# Patient Record
Sex: Male | Born: 1990 | Race: Black or African American | Hispanic: No | Marital: Single | Smoking: Current some day smoker
Health system: Southern US, Community
[De-identification: ages and names within clinical notes are randomized; demographics above are authoritative.]

---
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
## 2011-04-19 ENCOUNTER — Encounter: Payer: Self-pay | Admitting: Adult Health

## 2011-04-19 ENCOUNTER — Emergency Department (HOSPITAL_COMMUNITY)
Admission: EM | Admit: 2011-04-19 | Discharge: 2011-04-19 | Disposition: A | Discharge status: Home / Self Care | Payer: Self-pay | Attending: Emergency Medicine | Admitting: Emergency Medicine

## 2011-04-19 DIAGNOSIS — Z202 Contact with and (suspected) exposure to infections with a predominantly sexual mode of transmission: Secondary | ICD-10-CM

## 2011-04-19 MED ORDER — LIDOCAINE HCL 1 % IJ SOLN
INTRAMUSCULAR | Status: AC
  Administered 2011-04-19: 20 mL via INTRAMUSCULAR
  Filled 2011-04-19: qty 20

## 2011-04-19 MED ORDER — CEFTRIAXONE SODIUM 250 MG IJ SOLR
250.0000 mg | Freq: Once | INTRAMUSCULAR | Status: AC
  Administered 2011-04-19: 250 mg via INTRAMUSCULAR
  Filled 2011-04-19: qty 250

## 2011-04-19 MED ORDER — AZITHROMYCIN 250 MG PO TABS
1000.0000 mg | ORAL_TABLET | Freq: Once | ORAL | Status: AC
  Administered 2011-04-19: 1000 mg via ORAL
  Filled 2011-04-19: qty 4

## 2011-04-19 NOTE — ED Notes (Signed)
Denies sx. Partner has positive sx

## 2011-04-19 NOTE — ED Notes (Signed)
Partner is experiencing STD symptoms. Pt denies symptoms but is wanting to be tested.

## 2011-04-19 NOTE — ED Provider Notes (Signed)
Medical screening examination/treatment/procedure(s) were performed by non-physician practitioner and as supervising physician I was immediately available for consultation/collaboration.  Flint Melter, MD 04/19/11 [phone]

## 2011-04-19 NOTE — ED Provider Notes (Signed)
History     CSN: [id number] Arrival date & time: 04/19/2011 12:43 PM   First MD Initiated Contact with Patient 04/19/11 1518      Chief Complaint  Patient presents with  . Exposure to STD    (Consider location/radiation/quality/duration/timing/severity/associated sxs/prior treatment) Patient is a 20 y.o. male presenting with STD exposure. The history is provided by the patient.  Exposure to STD This is a new problem. Pertinent negatives include no abdominal pain, chills, fever, nausea, rash, sore throat or vomiting.  Pt states his boyfriend has had some penile discharge. Was seen and treated for possible STI. States that he has no symptoms but has had intercourse with his boyfriend while he had the discharge. Pt denies fever, chills, penile discharge, pain with urination, abdominal pain.   History reviewed. No pertinent past medical history.  History reviewed. No pertinent past surgical history.  History reviewed. No pertinent family history.  History  Substance Use Topics  . Smoking status: Never Smoker   . Smokeless tobacco: Not on file  . Alcohol Use: Yes      Review of Systems  Constitutional: Negative for fever and chills.  HENT: Negative for sore throat.   Gastrointestinal: Negative for nausea, vomiting and abdominal pain.  Skin: Negative for rash.  All other systems reviewed and are negative.    Allergies  Review of patient's allergies indicates no known allergies.  Home Medications  No current outpatient prescriptions on file.  BP 117/95  Pulse 76  Temp 98.9 F (37.2 C)  Resp 20  SpO2 100%  Physical Exam  Nursing note and vitals reviewed. Constitutional: He is oriented to person, place, and time. He appears well-developed and well-nourished.  Eyes: Conjunctivae are normal.  Neck: Neck supple.  Cardiovascular: Normal rate, regular rhythm and normal heart sounds.   Pulmonary/Chest: Effort normal and breath sounds normal. No respiratory distress.    Genitourinary: Penis normal. No penile tenderness.       No penile lesions or discharge  Neurological: He is alert and oriented to person, place, and time.  Skin: Skin is warm and dry. No rash noted.  Psychiatric: He has a normal mood and affect. His behavior is normal.    ED Course  Procedures (including critical care time)  Genital GC/Chlamydia cultures sent. Pt has not symptoms but has had direct exposure. Will treat with Rocephin 250mg  IM and Zithromax 1g PO.    MDM          Lottie Mussel, PA 04/19/11 1554

## 2011-04-20 LAB — GC/CHLAMYDIA PROBE AMP, GENITAL
Chlamydia, DNA Probe: NEGATIVE
GC Probe Amp, Genital: NEGATIVE

## 2011-06-18 ENCOUNTER — Emergency Department (HOSPITAL_COMMUNITY)
Admission: EM | Admit: 2011-06-18 | Discharge: 2011-06-18 | Disposition: A | Discharge status: Home / Self Care | Payer: Self-pay | Attending: Emergency Medicine | Admitting: Emergency Medicine

## 2011-06-18 ENCOUNTER — Encounter (HOSPITAL_COMMUNITY): Payer: Self-pay | Admitting: Emergency Medicine

## 2011-06-18 DIAGNOSIS — R112 Nausea with vomiting, unspecified: Secondary | ICD-10-CM | POA: Insufficient documentation

## 2011-06-18 DIAGNOSIS — R509 Fever, unspecified: Secondary | ICD-10-CM | POA: Insufficient documentation

## 2011-06-18 DIAGNOSIS — F17200 Nicotine dependence, unspecified, uncomplicated: Secondary | ICD-10-CM | POA: Insufficient documentation

## 2011-06-18 DIAGNOSIS — R109 Unspecified abdominal pain: Secondary | ICD-10-CM | POA: Insufficient documentation

## 2011-06-18 DIAGNOSIS — R197 Diarrhea, unspecified: Secondary | ICD-10-CM | POA: Insufficient documentation

## 2011-06-18 MED ORDER — ONDANSETRON 8 MG PO TBDP
8.0000 mg | ORAL_TABLET | Freq: Once | ORAL | Status: AC
  Administered 2011-06-18: 8 mg via ORAL
  Filled 2011-06-18: qty 1

## 2011-06-18 NOTE — Discharge Instructions (Signed)
Try to drink plenty of fluids over the next few days.  You may return to the ER at any time for worsening condition or any new symptoms that concern you.   B.R.A.T. Diet Your doctor has recommended the B.R.A.T. diet for you or your child until the condition improves. This is often used to help control diarrhea and vomiting symptoms. If you or your child can tolerate clear liquids, you may have:  Bananas.   Rice.   Applesauce.   Toast (and other simple starches such as crackers, potatoes, noodles).  Be sure to avoid dairy products, meats, and fatty foods until symptoms are better. Fruit juices such as apple, grape, and prune juice can make diarrhea worse. Avoid these. Continue this diet for 2 days or as instructed by your caregiver. Document Released: 04/24/2005 Document Revised: 01/04/2011 Document Reviewed: 10/11/2006 [facility] Patient Information 2012 [place], [place].

## 2011-06-18 NOTE — ED Provider Notes (Signed)
History     CSN: [id number]  Arrival date & time 06/18/11  1138   First MD Initiated Contact with Patient 06/18/11 1207      Chief Complaint  Patient presents with  . Nausea    x 24 hrs  . Fever    "approx 100"  . Emesis    x 8 in 24 hrs  . Diarrhea    x 2    (Consider location/radiation/quality/duration/timing/severity/associated sxs/prior treatment) HPI Comments: Patient reports nausea and vomiting and diarrhea that began last night. Reports that his boyfriend has had the same symptoms and the patient has been taking care of his boyfriend while he has been sick.  Patient states that he had about 8 episodes of diarrhea and a few episodes of vomiting. States he had one or 2 episodes this morning but is largely feeling much better. States that his abdomen is sore but does not "really hurt."  Denies any medical problems. Denies any blood in his emesis or diarrhea. Denies any urinary symptoms  Patient is a 21 y.o. male presenting with fever, vomiting, and diarrhea. The history is provided by the patient.  Fever Primary symptoms of the febrile illness include fever, vomiting and diarrhea. Primary symptoms do not include abdominal pain or dysuria.  Emesis  Associated symptoms include diarrhea and a fever. Pertinent negatives include no abdominal pain.  Diarrhea The primary symptoms include fever, vomiting and diarrhea. Primary symptoms do not include abdominal pain or dysuria.    History reviewed. No pertinent past medical history.  History reviewed. No pertinent past surgical history.  Family History  Problem Relation Age of Onset  . Hypertension Mother     History  Substance Use Topics  . Smoking status: Current Some Day Smoker  . Smokeless tobacco: Not on file  . Alcohol Use: Yes      Review of Systems  Constitutional: Positive for fever.  Gastrointestinal: Positive for vomiting and diarrhea. Negative for abdominal pain, blood in stool, abdominal distention and anal  bleeding.  Genitourinary: Negative for dysuria, urgency and frequency.  Neurological: Negative for weakness.    Allergies  Review of patient's allergies indicates no known allergies.  Home Medications   Current Outpatient Rx  Name Route Sig Dispense Refill  . NAPROXEN SODIUM 220 MG PO TABS Oral Take 220 mg by mouth 2 (two) times daily with a meal.    . SODIUM & POTASSIUM BICARBONATE PO TBEF Oral Take 1 tablet by mouth daily as needed. For cold symptom relief      BP 133/75  Pulse 85  Temp 100 F (37.8 C)  Resp 18  Wt 125 lb (56.7 kg)  SpO2 100%  Physical Exam  Nursing note and vitals reviewed. Constitutional: He is oriented to person, place, and time. He appears well-developed and well-nourished. No distress.  HENT:  Head: Normocephalic and atraumatic.  Neck: Neck supple.  Cardiovascular: Normal rate, regular rhythm and normal heart sounds.   Pulmonary/Chest: Breath sounds normal. No respiratory distress. He has no wheezes. He has no rales. He exhibits no tenderness.  Abdominal: Soft. Bowel sounds are normal. He exhibits no distension and no mass. There is no tenderness. There is no rebound and no guarding.  Neurological: He is alert and oriented to person, place, and time. He exhibits normal muscle tone.  Skin: No pallor.  Psychiatric: He has a normal mood and affect. His behavior is normal. Judgment and thought content normal.    ED Course  Procedures (including critical care time)  Labs Reviewed - No data to display No results found.   1. Nausea vomiting and diarrhea       MDM  Nontoxic, well-hydrated patient with history of nausea vomiting diarrhea x1 day. Patient history is positive for close sick contacts.  Patient states that he is already feeling much better but was required to come to the doctor to get a work note so he can return to work.  Patient discharged home.  Pt declines prescription for zofran.  Patient verbalizes understanding and agrees with plan.  Rise Patience, [place] 06/18/11 [phone]

## 2011-06-18 NOTE — ED Provider Notes (Signed)
Medical screening examination/treatment/procedure(s) were performed by non-physician practitioner and as supervising physician I was immediately available for consultation/collaboration.  Jayd Forrey, MD 06/18/11 1641 

## 2011-06-18 NOTE — ED Notes (Signed)
N/V/D/ x 24 hrs. Pts friend has similar sx. Tol sips of gatorade at present

## 2013-03-15 ENCOUNTER — Emergency Department (HOSPITAL_COMMUNITY)
Admission: EM | Admit: 2013-03-15 | Discharge: 2013-03-15 | Disposition: A | Discharge status: Home / Self Care | Payer: No Typology Code available for payment source | Attending: Emergency Medicine | Admitting: Emergency Medicine

## 2013-03-15 ENCOUNTER — Encounter (HOSPITAL_COMMUNITY): Payer: Self-pay | Admitting: Emergency Medicine

## 2013-03-15 DIAGNOSIS — S0990XA Unspecified injury of head, initial encounter: Secondary | ICD-10-CM | POA: Insufficient documentation

## 2013-03-15 DIAGNOSIS — F17200 Nicotine dependence, unspecified, uncomplicated: Secondary | ICD-10-CM | POA: Insufficient documentation

## 2013-03-15 DIAGNOSIS — Z791 Long term (current) use of non-steroidal anti-inflammatories (NSAID): Secondary | ICD-10-CM | POA: Insufficient documentation

## 2013-03-15 DIAGNOSIS — Y9389 Activity, other specified: Secondary | ICD-10-CM | POA: Insufficient documentation

## 2013-03-15 DIAGNOSIS — S0993XA Unspecified injury of face, initial encounter: Secondary | ICD-10-CM | POA: Insufficient documentation

## 2013-03-15 DIAGNOSIS — Y92410 Unspecified street and highway as the place of occurrence of the external cause: Secondary | ICD-10-CM | POA: Insufficient documentation

## 2013-03-15 DIAGNOSIS — IMO0002 Reserved for concepts with insufficient information to code with codable children: Secondary | ICD-10-CM | POA: Insufficient documentation

## 2013-03-15 MED ORDER — DIAZEPAM 5 MG PO TABS: 5.0000 mg | ORAL_TABLET | Freq: Two times a day (BID) | ORAL | Status: DC

## 2013-03-15 MED ORDER — OXYCODONE-ACETAMINOPHEN 5-325 MG PO TABS: 2.0000 | ORAL_TABLET | ORAL | Status: DC | PRN

## 2013-03-15 MED ORDER — KETOROLAC TROMETHAMINE 60 MG/2ML IM SOLN
60.0000 mg | Freq: Once | INTRAMUSCULAR | Status: AC
  Administered 2013-03-15: 60 mg via INTRAMUSCULAR
  Filled 2013-03-15: qty 2
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
## 2013-03-15 NOTE — ED Provider Notes (Signed)
Medical screening examination/treatment/procedure(s) were performed by non-physician practitioner and as supervising physician I was immediately available for consultation/collaboration.  Flint Melter, MD 03/15/13 [phone]

## 2013-08-06 ENCOUNTER — Encounter (HOSPITAL_COMMUNITY): Payer: Self-pay | Admitting: Emergency Medicine

## 2013-08-06 ENCOUNTER — Emergency Department (HOSPITAL_COMMUNITY): Payer: Self-pay

## 2013-08-06 ENCOUNTER — Emergency Department (HOSPITAL_COMMUNITY)
Admission: EM | Admit: 2013-08-06 | Discharge: 2013-08-06 | Disposition: A | Discharge status: Home / Self Care | Payer: Self-pay | Attending: Emergency Medicine | Admitting: Emergency Medicine

## 2013-08-06 DIAGNOSIS — R079 Chest pain, unspecified: Secondary | ICD-10-CM

## 2013-08-06 DIAGNOSIS — R0789 Other chest pain: Secondary | ICD-10-CM | POA: Insufficient documentation

## 2013-08-06 DIAGNOSIS — Z8719 Personal history of other diseases of the digestive system: Secondary | ICD-10-CM | POA: Insufficient documentation

## 2013-08-06 DIAGNOSIS — H6120 Impacted cerumen, unspecified ear: Secondary | ICD-10-CM | POA: Insufficient documentation

## 2013-08-06 DIAGNOSIS — F17200 Nicotine dependence, unspecified, uncomplicated: Secondary | ICD-10-CM | POA: Insufficient documentation

## 2013-08-06 LAB — BASIC METABOLIC PANEL
BUN: 8 mg/dL (ref 6–23)
CALCIUM: 9.4 mg/dL (ref 8.4–10.5)
CHLORIDE: 100 meq/L (ref 96–112)
CO2: 25 meq/L (ref 19–32)
Creatinine, Ser: 0.91 mg/dL (ref 0.50–1.35)
GFR calc Af Amer: >90 mL/min (ref >90)
GFR calc non Af Amer: >90 mL/min (ref >90)
GLUCOSE: 95 mg/dL (ref 70–99)
POTASSIUM: 3.9 meq/L (ref 3.7–5.3)
SODIUM: 140 meq/L (ref 137–147)

## 2013-08-06 LAB — CBC
HCT: 44.9 % (ref 39.0–52.0)
HEMOGLOBIN: 15.9 g/dL (ref 13.0–17.0)
MCH: 31 pg (ref 26.0–34.0)
MCHC: 35.4 g/dL (ref 30.0–36.0)
MCV: 87.5 fL (ref 78.0–100.0)
PLATELETS: 277 10*3/uL (ref 150–400)
RBC: 5.13 MIL/uL (ref 4.22–5.81)
RDW: 12.7 % (ref 11.5–15.5)
WBC: 9.8 10*3/uL (ref 4.0–10.5)

## 2013-08-06 LAB — I-STAT TROPONIN, ED: TROPONIN I, POC: 0 ng/mL (ref 0.00–0.08)

## 2013-08-06 LAB — D-DIMER, QUANTITATIVE (NOT AT ARMC): D DIMER QUANT: 0.32 ug{FEU}/mL (ref 0.00–0.48)

## 2013-08-06 MED ORDER — ANTIPYRINE-BENZOCAINE 5.4-1.4 % OT SOLN
3.0000 [drp] | OTIC | Status: DC | PRN
  Filled 2013-08-06: qty 10

## 2013-08-06 MED ORDER — DOCUSATE SODIUM 50 MG/5ML PO LIQD
50.0000 mg | Freq: Once | ORAL | Status: AC
  Administered 2013-08-06: 50 mg via ORAL
  Filled 2013-08-06: qty 10

## 2013-08-06 NOTE — ED Provider Notes (Signed)
Medical screening examination/treatment/procedure(s) were performed by non-physician practitioner and as supervising physician I was immediately available for consultation/collaboration.   EKG Interpretation None      Devoria AlbeIva Quanasia Defino, MD, Armando GangFACEP   Ward GivensIva L Zaven Klemens, MD 08/06/13 [phone]

## 2013-08-06 NOTE — ED Notes (Signed)
PA at bedside  Ear wax removal

## 2013-08-06 NOTE — ED Notes (Signed)
Intermittent Central to R sided CP x [redacted] weeks along with ear pain.  Rates pain 5/10 and described as tightness.

## 2013-08-06 NOTE — ED Provider Notes (Signed)
CSN: [id number]     Arrival date & time 08/06/13  1415 History   First MD Initiated Contact with Patient 08/06/13 1605     Chief Complaint  Patient presents with  . Chest Pain  . Otalgia     (Consider location/radiation/quality/duration/timing/severity/associated sxs/prior Treatment) HPI Peter Hogan is a 23 y.o. male who presents emergency department with 2 complaints. Patient states initial complaint is chest pain. States pain is in the center of his chest. Comes and goes. It does not radiate. Feels like "tightness." He states he does have some acid reflux and feels some sour taste in his mouth and his gone for several days. He denies any cough or difficulty breathing. He has not taken any medications for this. He denies any prior lung or heart problems. He denies any pain on exertion. He denies any swelling or pain in his extremities. No recent travel or surgeries. He does not take any medications daily does not have any medical problems. His second complaint is bilateral ear pain, right worse than left. He states that he is losing hearing in his ears. He states he feels that they're "full of ear wax." Patient states he has tried to get it out with Q-tips with no success. No other complaints.   History reviewed. No pertinent past medical history. History reviewed. No pertinent past surgical history. Family History  Problem Relation Age of Onset  . Hypertension Mother    History  Substance Use Topics  . Smoking status: Current Some Day Smoker  . Smokeless tobacco: Not on file  . Alcohol Use: Yes     Comment: socially    Review of Systems  Constitutional: Negative for fever and chills.  HENT: Positive for ear pain and hearing loss. Negative for congestion and sore throat.   Respiratory: Positive for chest tightness. Negative for cough and shortness of breath.   Cardiovascular: Positive for chest pain. Negative for palpitations and leg swelling.  Gastrointestinal: Negative for  nausea, vomiting, abdominal pain, diarrhea and abdominal distention.  Genitourinary: Negative for dysuria, urgency, frequency and hematuria.  Musculoskeletal: Negative for arthralgias, myalgias, neck pain and neck stiffness.  Skin: Negative for rash.  Allergic/Immunologic: Negative for immunocompromised state.  Neurological: Negative for dizziness, weakness, light-headedness, numbness and headaches.      Allergies  Review of patient's allergies indicates no known allergies.  Home Medications   Current Outpatient Rx  Name  Route  Sig  Dispense  Refill  . diphenhydrAMINE (BENADRYL) 25 mg capsule   Oral   Take 25 mg by mouth daily as needed for allergies.          BP 139/78  Pulse 107  Temp(Src) 98.4 F (36.9 C) (Oral)  Resp 20  SpO2 100% Physical Exam  Nursing note and vitals reviewed. Constitutional: He appears well-developed and well-nourished. No distress.  HENT:  Head: Normocephalic and atraumatic.  Mouth/Throat: Oropharynx is clear and moist.  Bilateral cerumen impaction  Eyes: Conjunctivae are normal.  Neck: Neck supple.  Cardiovascular: Normal rate, regular rhythm and normal heart sounds.   Pulmonary/Chest: Effort normal. No respiratory distress. He has no wheezes. He has no rales. He exhibits no tenderness.  Abdominal: Soft. Bowel sounds are normal. He exhibits no distension. There is no tenderness. There is no rebound.  Musculoskeletal: He exhibits no edema.  Neurological: He is alert.  Skin: Skin is warm and dry.    ED Course  Procedures (including critical care time) Labs Review Labs Reviewed  CBC  BASIC METABOLIC PANEL  D-DIMER, QUANTITATIVE  Rosezena Sensor, ED   Imaging Review Dg Chest 2 View  08/06/2013   CLINICAL DATA:  Chest pain  EXAM: CHEST  2 VIEW  COMPARISON:  None.  FINDINGS: The lungs are clear. Heart size and pulmonary vascularity are normal. No adenopathy. No pneumothorax. No bone lesions.  IMPRESSION: No abnormality noted.    Electronically Signed   By: Bretta Bang M.D.   On: 08/06/2013 15:03     EKG Interpretation None      MDM   Final diagnoses:  Chest pain  Cerumen impaction    Patient with nonspecific chest pain for 2 weeks and bilateral cerumen impaction. Patient's EKG is unremarkable, his lab work is negative including d-dimer and troponin. I do not think patient has a CXR a PE. He is in no distress. Suspect pain is most likely musculoskeletal versus GERD. We irrigated his bilaterally ears with some cerumen removal however they remain impacted. Have irrigated right ear 4 times. We have tried peroxide solution for soaking, as well as Colace. Patient does state that he is feeling somewhat better. I will discharge him home with a [place] drops to help to soften cerumen, and at home removal with over-the-counter products. Have also given an ENT followup as requested by him. At this time is stable for discharge home.  Filed Vitals:   08/06/13 1422 08/06/13 1630  BP: 139/78 126/57  Pulse: 107 65  Temp: 98.4 F (36.9 C)   TempSrc: Oral   Resp: 20 18  SpO2: 100% 99%      Lottie Mussel, PA-C 08/06/13 1848

## 2013-08-06 NOTE — Discharge Instructions (Signed)
Use ear drops to help break up ear wax. Use over the counter products to remove ear wax. Follow up with ENT if needed.    Cerumen Impaction A cerumen impaction is when the wax in your ear forms a plug. This plug usually causes reduced hearing. Sometimes it also causes an earache or dizziness. Removing a cerumen impaction can be difficult and painful. The wax sticks to the ear canal. The canal is sensitive and bleeds easily. If you try to remove a heavy wax buildup with a cotton tipped swab, you may push it in further. Irrigation with water, suction, and small ear curettes may be used to clear out the wax. If the impaction is fixed to the skin in the ear canal, ear drops may be needed for a few days to loosen the wax. People who build up a lot of wax frequently can use ear wax removal products available in your local drugstore. SEEK MEDICAL CARE IF:  You develop an earache, increased hearing loss, or marked dizziness. Document Released: 06/01/2004 Document Revised: 07/17/2011 Document Reviewed: 07/22/2009 [facility] Patient Information 2014 [place], [place].

## 2013-08-06 NOTE — ED Notes (Signed)
Irrigated pts ear x8.

## 2015-10-04 IMAGING — CR DG CHEST 2V
2 series · 2 of 2 positions shown · non-contrast
Comparison: None.

CLINICAL DATA: Chest pain

EXAM:
CHEST  2 VIEW

[w chest pa]
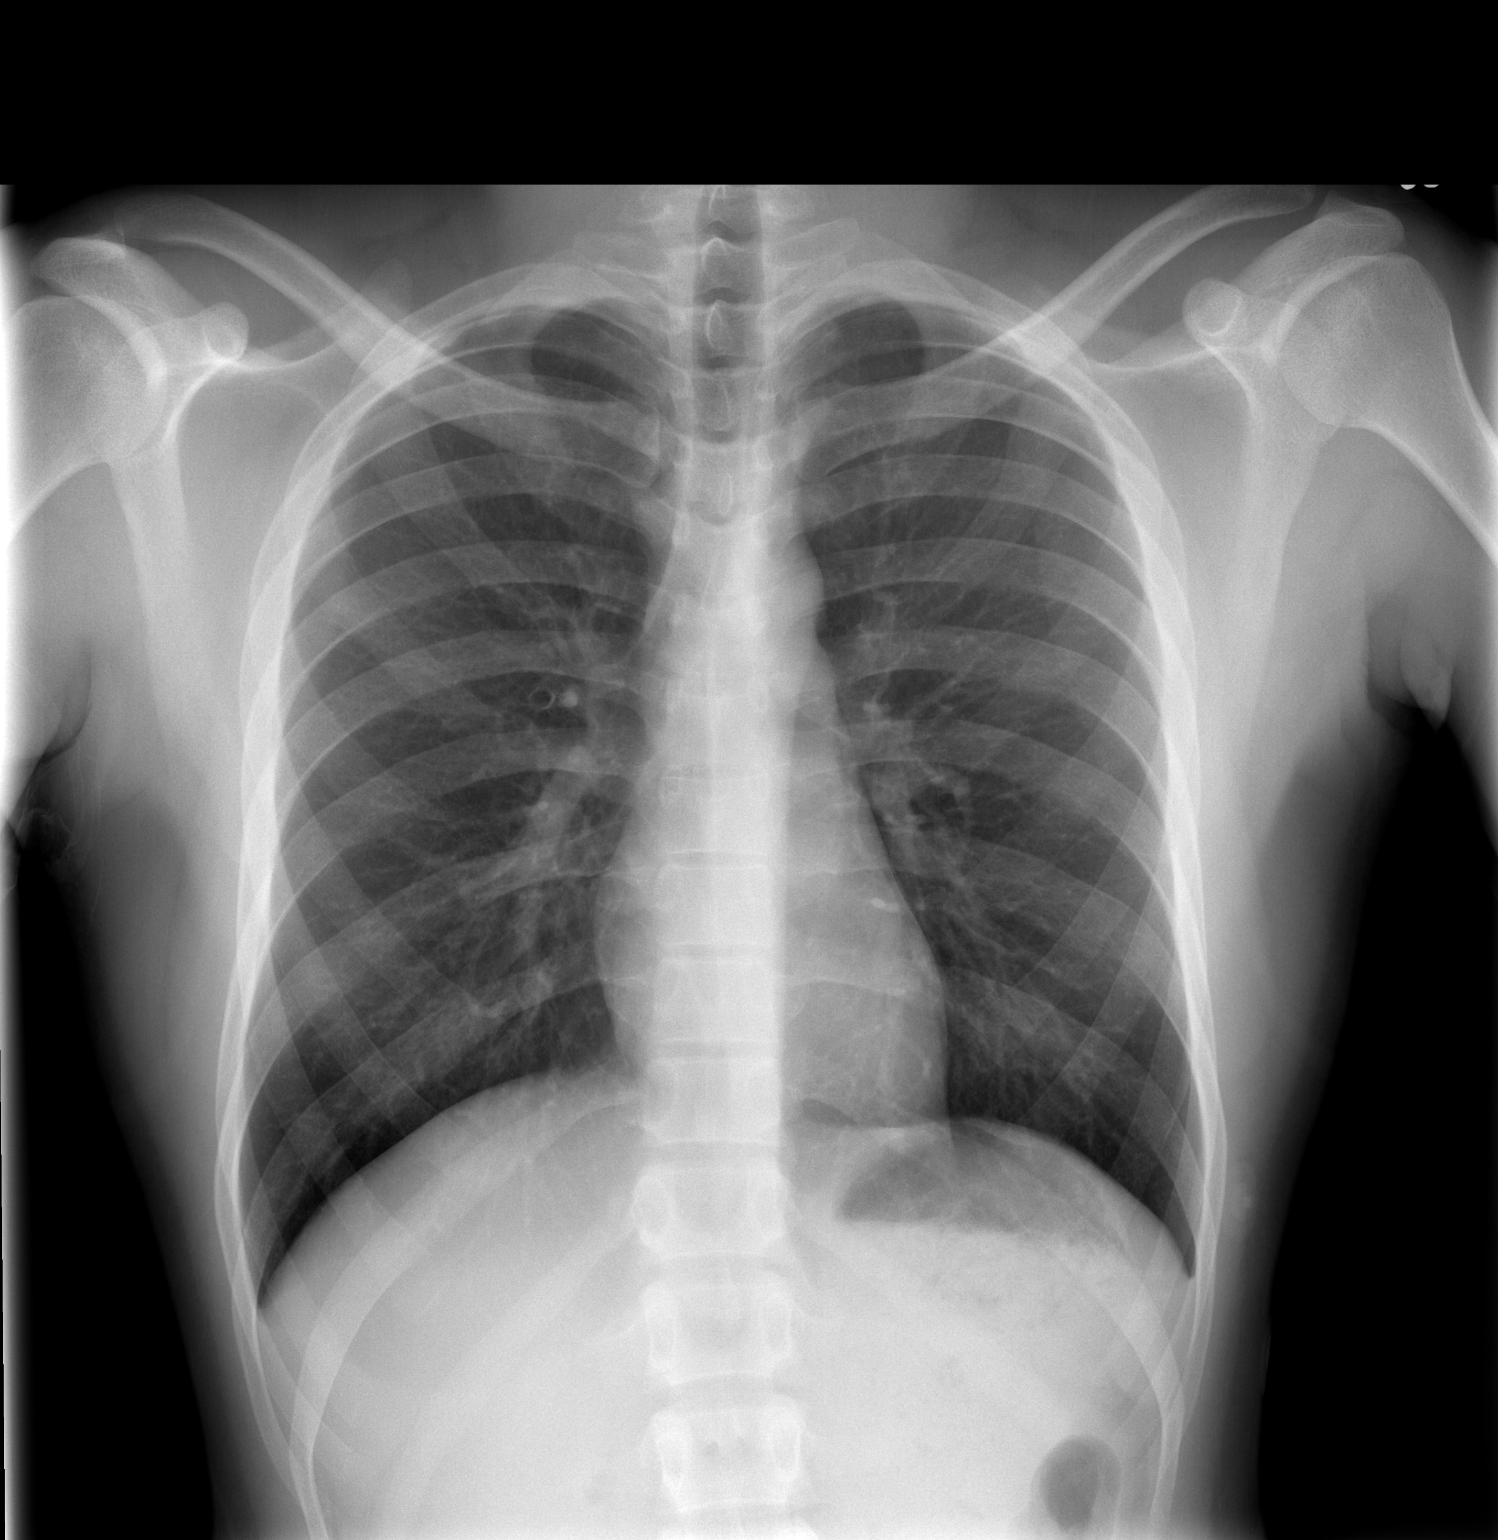

[w chest lat]
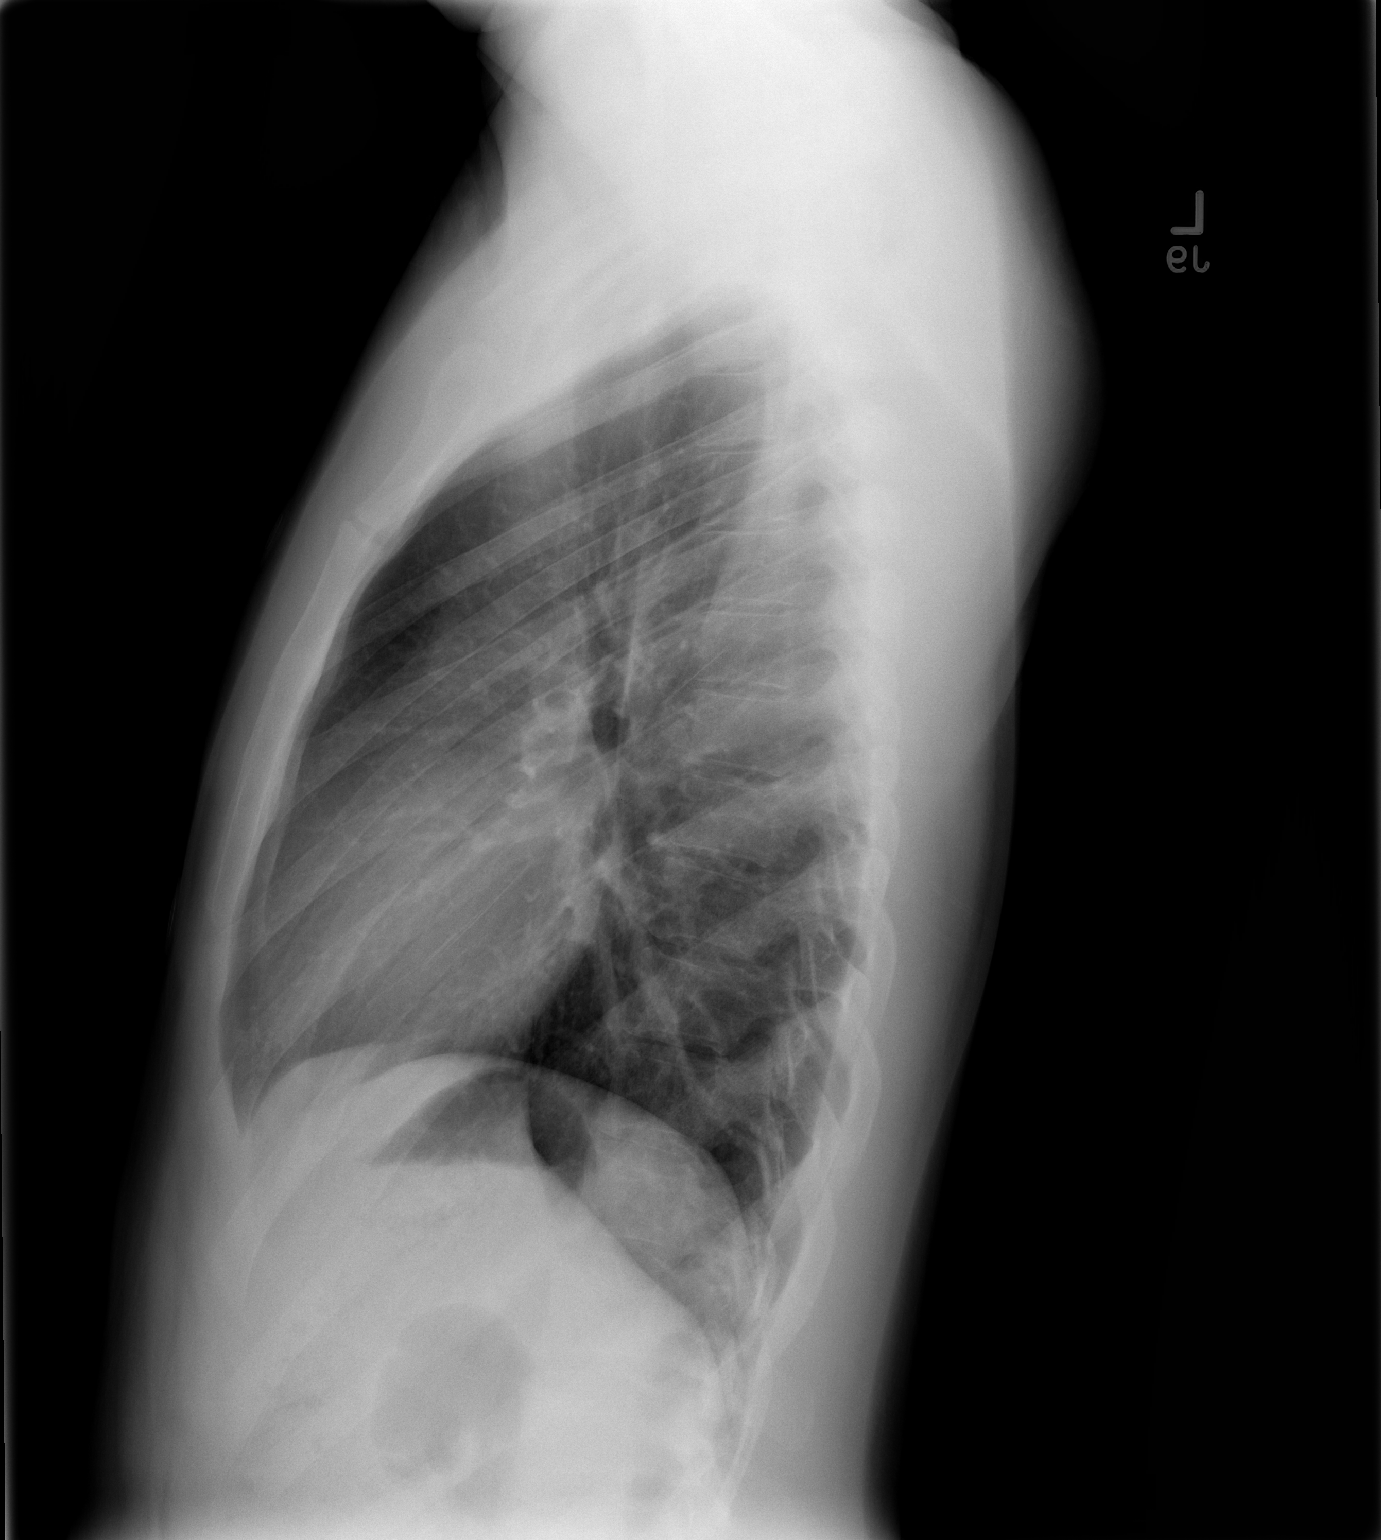

[2 of 2 positions shown; findings below may reference images not displayed]

FINDINGS: The lungs are clear. Heart size and pulmonary vascularity are
normal. No adenopathy. No pneumothorax. No bone lesions.
IMPRESSION: No abnormality noted.

## 2016-01-04 ENCOUNTER — Encounter (INDEPENDENT_AMBULATORY_CARE_PROVIDER_SITE_OTHER): Payer: Self-pay | Admitting: *Deleted

## 2016-01-04 DIAGNOSIS — Z7251 High risk heterosexual behavior: Secondary | ICD-10-CM

## 2016-01-04 NOTE — Progress Notes (Addendum)
It was found that Peter Hogan is planning on moving to [place] at the end of September. This is not enough time to screen and enroll him onto study. Application for advancing access was sent in. I drew blood for Cmet and HIV rapid. Dr. Daiva EvesVan Dam to prescribe one month of Truvada to get him through till he moves and hopefully screens in [place]. [name] RN   HIV Rapid Test  = Non-reactive

## 2016-01-05 LAB — COMPREHENSIVE METABOLIC PANEL
ALT: 118 U/L — ABNORMAL HIGH (ref 9–46)
AST: 190 U/L — ABNORMAL HIGH (ref 10–40)
Albumin: 4.4 g/dL (ref 3.6–5.1)
Alkaline Phosphatase: 59 U/L (ref 40–115)
BUN: 10 mg/dL (ref 7–25)
CHLORIDE: 103 mmol/L (ref 98–110)
CO2: 25 mmol/L (ref 20–31)
Calcium: 9.6 mg/dL (ref 8.6–10.3)
Creat: 0.99 mg/dL (ref 0.60–1.35)
GLUCOSE: 91 mg/dL (ref 65–99)
POTASSIUM: 4 mmol/L (ref 3.5–5.3)
Sodium: 139 mmol/L (ref 135–146)
TOTAL PROTEIN: 6.9 g/dL (ref 6.1–8.1)
Total Bilirubin: 0.5 mg/dL (ref 0.2–1.2)

## 2016-01-05 MED ORDER — EMTRICITABINE-TENOFOVIR DF 200-300 MG PO TABS: 1.0000 | ORAL_TABLET | Freq: Every day | ORAL | 0 refills | Status: DC

## 2016-01-05 NOTE — Addendum Note (Signed)
Addended by: Nicolasa DuckingEPPERSON, Cloyce Paterson S on: 01/05/2016 03:42 PM   Modules accepted: Orders

## 2016-01-29 ENCOUNTER — Ambulatory Visit (HOSPITAL_COMMUNITY)
Admission: EM | Admit: 2016-01-29 | Discharge: 2016-01-29 | Disposition: A | Discharge status: Home / Self Care | Payer: Self-pay | Attending: Internal Medicine | Admitting: Internal Medicine

## 2016-01-29 ENCOUNTER — Encounter (HOSPITAL_COMMUNITY): Payer: Self-pay | Admitting: Family Medicine

## 2016-01-29 ENCOUNTER — Other Ambulatory Visit: Payer: Self-pay | Admitting: Infectious Disease

## 2016-01-29 DIAGNOSIS — J029 Acute pharyngitis, unspecified: Secondary | ICD-10-CM

## 2016-01-29 LAB — POCT RAPID STREP A: Streptococcus, Group A Screen (Direct): NEGATIVE

## 2016-01-29 LAB — POCT INFECTIOUS MONO SCREEN: Mono Screen: NEGATIVE

## 2016-01-29 MED ORDER — AMOXICILLIN-POT CLAVULANATE 875-125 MG PO TABS
1.0000 | ORAL_TABLET | Freq: Two times a day (BID) | ORAL | 0 refills | Status: DC
Start: 2016-01-29 — End: 2023-08-17

## 2016-01-29 MED ORDER — ACETAMINOPHEN 325 MG PO TABS
650.0000 mg | ORAL_TABLET | Freq: Once | ORAL | Status: AC
  Administered 2016-01-29: 650 mg via ORAL

## 2016-01-29 MED ORDER — ACETAMINOPHEN 160 MG/5ML PO SOLN
ORAL | Status: AC
  Filled 2016-01-29: qty 20.3
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
## 2016-01-29 NOTE — ED Provider Notes (Signed)
CSN: [id number]     Arrival date & time 01/29/16  1420 History   None    Chief Complaint  Patient presents with  . Sore Throat   (Consider location/radiation/quality/duration/timing/severity/associated sxs/prior Treatment)  HPI   The patient is a 25 year old male presenting today with complaints of fever, sore throat and generalized muscle aches for the past 2 days. Patient reports sick contact with someone with positive strep throat last week. Denies significant medical history.   History reviewed. No pertinent past medical history. History reviewed. No pertinent surgical history. Family History  Problem Relation Age of Onset  . Hypertension Mother    Social History  Substance Use Topics  . Smoking status: Current Some Day Smoker  . Smokeless tobacco: Never Used  . Alcohol use Yes     Comment: socially    Review of Systems  Constitutional: Positive for chills, fatigue and fever. Negative for diaphoresis.  HENT: Positive for sore throat and trouble swallowing. Negative for congestion, drooling, hearing loss, sinus pressure and sneezing.        Reports can swallow, but it hurts.   Eyes: Negative.   Respiratory: Negative.  Negative for cough and shortness of breath.   Cardiovascular: Negative.  Negative for chest pain.  Gastrointestinal: Negative.  Negative for diarrhea, nausea and vomiting.  Endocrine: Negative.   Genitourinary: Negative.   Musculoskeletal: Negative.   Skin: Negative.  Negative for rash.  Allergic/Immunologic: Negative.  Negative for environmental allergies, food allergies and immunocompromised state.  Neurological: Negative.  Negative for headaches.  Hematological: Negative.   Psychiatric/Behavioral: Negative.     Allergies  Review of patient's allergies indicates no known allergies.  Home Medications   Prior to Admission medications   Medication Sig Start Date End Date Taking? Authorizing Provider  amoxicillin-clavulanate (AUGMENTIN) 875-125 MG  tablet Take 1 tablet by mouth every 12 (twelve) hours. 01/29/16   Servando Salinaatherine H Kyann Heydt, NP  diphenhydrAMINE (BENADRYL) 25 mg capsule Take 25 mg by mouth daily as needed for allergies.    Historical Provider, MD  emtricitabine-tenofovir (TRUVADA) 200-300 MG tablet Take 1 tablet by mouth daily. 01/05/16   Randall Hissornelius N Van Dam, MD   Meds Ordered and Administered this Visit   Medications  acetaminophen (TYLENOL) tablet 650 mg (650 mg Oral Given 01/29/16 1545)    BP 122/83   Pulse 99   Temp 102.5 F (39.2 C)   Resp 18   SpO2 97%  No data found.   Physical Exam  Constitutional: He appears well-developed and well-nourished. No distress.  HENT:  Head: Normocephalic and atraumatic.  Right Ear: External ear normal.  Left Ear: External ear normal.  Nose: Nose normal.  Bilateral tympanic membranes pearly gray in appearance. Unable to visualize bony prominences or light reflexes due to cerumen. Posterior oropharynx is red with copious mucoid discharge present.   Cardiovascular: Regular rhythm, normal heart sounds and intact distal pulses.  Exam reveals no gallop and no friction rub.   No murmur heard. Tachycardic c/w fever.  Pulmonary/Chest: Effort normal and breath sounds normal. No respiratory distress. He has no wheezes. He has no rales. He exhibits no tenderness.  No adventitious breath sounds noted  Skin: Skin is warm and dry. He is not diaphoretic.  Nursing note and vitals reviewed.   Urgent Care Course   Clinical Course    Procedures (including critical care time)  Labs Review Labs Reviewed  POCT RAPID STREP A  POCT INFECTIOUS MONO SCREEN    Imaging Review No results found.  Results for orders placed or performed during the hospital encounter of 01/29/16  POCT rapid strep A [facility])  Result Value Ref Range   Streptococcus, Group A Screen (Direct) NEGATIVE NEGATIVE  Infectious mono screen, POC  Result Value Ref Range   Mono Screen NEGATIVE NEGATIVE   Culture is  pending. Discussed unlikelihood of flu with Dr. Ria Clock.    MDM   1. Acute pharyngitis, unspecified pharyngitis type    Meds ordered this encounter  Medications  . acetaminophen (TYLENOL) tablet 650 mg  . amoxicillin-clavulanate (AUGMENTIN) 875-125 MG tablet    Sig: Take 1 tablet by mouth every 12 (twelve) hours.    Dispense:  14 tablet    Refill:  0   The usual and customary discharge instructions and warnings were given.  The patient verbalizes understanding and agrees to plan of care.       Servando Salina, NP 01/29/16 1712

## 2016-01-29 NOTE — Discharge Instructions (Signed)
Your culture is pending.  We will call you with positive results if necessary.  If no improvement within next 7 days follow up with your PCP.

## 2016-02-01 LAB — CULTURE, GROUP A STREP (THRC)

## 2023-07-23 ENCOUNTER — Telehealth: Payer: Self-pay

## 2023-07-23 NOTE — Telephone Encounter (Signed)
 Received paperwork for FMLA but patient is not a patient here at this clinic. I did speak with him to let him know that he may want to reach out to who handles his FMLA to get that sent to the correct office.

## 2023-07-27 ENCOUNTER — Telehealth: Payer: Self-pay

## 2023-07-27 ENCOUNTER — Other Ambulatory Visit (HOSPITAL_COMMUNITY): Payer: Self-pay

## 2023-07-27 NOTE — Telephone Encounter (Signed)
 Pharmacy Patient Advocate Encounter  Insurance verification completed.   The patient is insured through [place]   Ran test claim for [employer]. Currently a quantity of 30 is a 30 day supply and the co-pay is $0.00 . [place] $0.00 [place] $0.00  This test claim was processed through [facility]- copay amounts may vary at other pharmacies due to pharmacy/plan contracts, or as the patient moves through the different stages of their insurance plan.

## 2023-08-02 ENCOUNTER — Ambulatory Visit: Admitting: Internal Medicine

## 2023-08-02 ENCOUNTER — Telehealth: Payer: Self-pay

## 2023-08-02 DIAGNOSIS — Z758 Other problems related to medical facilities and other health care: Secondary | ICD-10-CM

## 2023-08-03 ENCOUNTER — Telehealth: Payer: Self-pay | Admitting: *Deleted

## 2023-08-03 NOTE — Progress Notes (Signed)
 Complex Care Management Note  Care Guide Note 08/03/2023 Name: Christiano Blandon MRN: [id number] DOB: 07/25/1990  Aden Dacy is a 33 y.o. year old male who sees Patient, No Pcp Per for primary care. I reached out to [place] Sean by phone today to offer complex care management services.  Mr. Bechtol was given information about Complex Care Management services today including:   The Complex Care Management services include support from the care team which includes your Nurse Care Manager, Clinical Social Worker, or Pharmacist.  The Complex Care Management team is here to help remove barriers to the health concerns and goals most important to you. Complex Care Management services are voluntary, and the patient may decline or stop services at any time by request to their care team member.   Pt given contact info for [facility] that is closest to his address, recently moved here from [place]. Will establish care with pcp and call me back for CM needs. Pt appreciative   Encounter Outcome:  Patient Request to Call Back  Burman Nieves, CMA [place]  [facility], [facility] Guide Direct Dial: [phone]  Fax: [phone] Website: [email or web address]

## 2023-08-14 ENCOUNTER — Ambulatory Visit: Admitting: Pharmacist

## 2023-08-14 ENCOUNTER — Ambulatory Visit: Admitting: Physician Assistant

## 2023-08-15 NOTE — Progress Notes (Unsigned)
   08/15/2023  HPI: Peter Hogan is a 33 y.o. male who presents to the [facility] clinic today to initiate care for a newly diagnosed HIV infection.  There are no active problems to display for this patient.   Patient's Medications  New Prescriptions   No medications on file  Previous Medications   AMOXICILLIN-CLAVULANATE (AUGMENTIN) 875-125 MG TABLET    Take 1 tablet by mouth every 12 (twelve) hours.   DIPHENHYDRAMINE (BENADRYL) 25 MG CAPSULE    Take 25 mg by mouth daily as needed for allergies.   EMTRICITABINE-TENOFOVIR (TRUVADA) 200-300 MG TABLET    Take 1 tablet by mouth daily.  Modified Medications   No medications on file  Discontinued Medications   No medications on file    Labs: No results found for: "HIV1RNAQUANT", "HIV1RNAVL", "CD4TABS"  RPR and STI No results found for: "LABRPR", "RPRTITER"  STI Results CT  Latest Ref Rng & Units NEGATIVE  04/19/2011  3:30 PM NEGATIVE     Hepatitis B No results found for: "HEPBSAB", "HEPBSAG", "HEPBCAB" Hepatitis C No results found for: "HEPCAB", "HCVRNAPCRQN" Hepatitis A No results found for: "HAV" Lipids: No results found for: "CHOL", "TRIG", "HDL", "CHOLHDL", "VLDL", "LDLCALC"  Current HIV Regimen: Treatment naive  Assessment: Peter Hogan is here today to initiate care with *** for his newly diagnosed HIV infection.  He is treatment naive with an initial HIV viral load of *** and a CD4 count of ***.  No resistance mutations found on initial genotype. Will start patient on ***.  Plan: ***  Cassie L. Kuppelweiser, PharmD, BCIDP, AAHIVP, CPP Clinical Pharmacist Practitioner - Infectious Diseases Clinical Pharmacist Lead - Specialty Pharmacy [facility] for Infectious Disease 08/15/2023, 5:45 PM

## 2023-08-16 ENCOUNTER — Other Ambulatory Visit (HOSPITAL_COMMUNITY): Payer: Self-pay

## 2023-08-16 ENCOUNTER — Other Ambulatory Visit: Payer: Self-pay

## 2023-08-16 ENCOUNTER — Ambulatory Visit: Admitting: Pharmacist

## 2023-08-16 ENCOUNTER — Other Ambulatory Visit (HOSPITAL_COMMUNITY)
Admission: RE | Admit: 2023-08-16 | Discharge: 2023-08-16 | Disposition: A | Discharge status: Home / Self Care | Source: Ambulatory Visit | Attending: Physician Assistant | Admitting: Physician Assistant

## 2023-08-16 ENCOUNTER — Ambulatory Visit (INDEPENDENT_AMBULATORY_CARE_PROVIDER_SITE_OTHER): Admitting: Physician Assistant

## 2023-08-16 ENCOUNTER — Other Ambulatory Visit: Payer: Self-pay | Admitting: Pharmacist

## 2023-08-16 ENCOUNTER — Encounter: Payer: Self-pay | Admitting: Physician Assistant

## 2023-08-16 VITALS — BP 127/83 | HR 77 | Temp 99.0°F | Resp 16 | Ht 67.0 in | Wt 149.8 lb

## 2023-08-16 DIAGNOSIS — Z79899 Other long term (current) drug therapy: Secondary | ICD-10-CM

## 2023-08-16 DIAGNOSIS — Z8619 Personal history of other infectious and parasitic diseases: Secondary | ICD-10-CM | POA: Diagnosis not present

## 2023-08-16 DIAGNOSIS — Z113 Encounter for screening for infections with a predominantly sexual mode of transmission: Secondary | ICD-10-CM

## 2023-08-16 DIAGNOSIS — Z0000 Encounter for general adult medical examination without abnormal findings: Secondary | ICD-10-CM | POA: Diagnosis not present

## 2023-08-16 DIAGNOSIS — B20 Human immunodeficiency virus [HIV] disease: Secondary | ICD-10-CM

## 2023-08-16 MED ORDER — BIKTARVY 50-200-25 MG PO TABS
1.0000 | ORAL_TABLET | Freq: Every day | ORAL | 5 refills | Status: AC
  Filled 2023-08-16: qty 30, 30d supply, fill #0
  Filled 2023-09-03: qty 30, 30d supply, fill #1
  Filled 2023-10-09: qty 30, 30d supply, fill #2
  Filled 2023-10-31: qty 30, 30d supply, fill #3

## 2023-08-16 NOTE — Patient Instructions (Signed)
 Hi Drayson!  Wonderful meeting you today! [facility] will reach out to you regarding your biktarvy deliveries. We will request records from [place] to determine which immunizations you may need.  Good luck at work Monday and dream of vacation :) See you in 4 months

## 2023-08-16 NOTE — Progress Notes (Signed)
 Specialty Pharmacy Initial Fill Coordination Note  Peter Hogan is a 33 y.o. male contacted today regarding initial fill of specialty medication(s) Bictegravir-Emtricitab-Tenofov [facility])   Patient requested Delivery   Delivery date: 08/20/23   Verified address: [address] [place] [address]   Medication will be filled on 08/17/23.   Patient is aware of $0 copayment.

## 2023-08-16 NOTE — Progress Notes (Unsigned)
 Subjective:    Patient ID: Peter Hogan, male    DOB: 12-01-1990, 33 y.o.   MRN: [id number]  Chief Complaint  Patient presents with  . New Patient (Initial Visit)    B20      HPI:  Peter Hogan is a 33 y.o. male  Bikttarvy since 2019. Morning. Tolerated it well.  June needs refills NO recent illnesses 2020 covid phizer 2 doses Last month versatile MSM.  Condoms. I Sti screning Gonorrhea last year  Treated for syphilis 2019.  Dentist January Medicaid PCP [facility]  [place] lOng. Black in mild black out no  [employer] Monday.  No Known Allergies    Outpatient Medications Prior to Visit  Medication Sig Dispense Refill  . bictegravir-emtricitabine-tenofovir AF (BIKTARVY) 50-200-25 MG TABS tablet Take by mouth daily.    Marland Kitchen amoxicillin-clavulanate (AUGMENTIN) 875-125 MG tablet Take 1 tablet by mouth every 12 (twelve) hours. (Patient not taking: Reported on 08/16/2023) 14 tablet 0  . diphenhydrAMINE (BENADRYL) 25 mg capsule Take 25 mg by mouth daily as needed for allergies. (Patient not taking: Reported on 08/16/2023)    . emtricitabine-tenofovir (TRUVADA) 200-300 MG tablet Take 1 tablet by mouth daily. (Patient not taking: Reported on 08/16/2023) 30 tablet 0   No facility-administered medications prior to visit.     No past medical history on file.   No past surgical history on file.     Review of Systems    Objective:    BP 127/83   Pulse 77   Temp 99 F (37.2 C) (Oral)   Resp 16   Ht 5\' 7"  (1.702 m)   Wt 149 lb 12.8 oz (67.9 kg)   SpO2 98%   BMI 23.46 kg/m  Nursing note and vital signs reviewed.  Physical Exam      08/16/2023    1:29 PM  Depression screen PHQ 2/9  Decreased Interest 0  Down, Depressed, Hopeless 1  PHQ - 2 Score 1       Assessment & Plan:    There are no active problems to display for this patient.    Problem List Items Addressed This Visit   None Visit Diagnoses       Human immunodeficiency virus (HIV) disease (HCC)     -  Primary   Relevant Medications   bictegravir-emtricitabine-tenofovir AF (BIKTARVY) 50-200-25 MG TABS tablet   Other Relevant Orders   Hemoglobin A1c   HIV Antibody (routine testing w rflx)   QuantiFERON-TB Gold Plus   COMPLETE METABOLIC PANEL WITHOUT GFR   CBC   HIV RNA, RTPCR W/R GT (RTI, PI,INT)   T-helper cell (CD4)- ([facility] clinic only)   RPR   Urine cytology ancillary only   Lipid panel   Hepatitis B surface antigen   Hepatitis B surface antibody,quantitative   Hepatitis B core antibody, total   Hepatitis C antibody   Toxoplasma gondii antibody, IgG   Varicella zoster antibody, IgG   Measles/Mumps/Rubella Immunity   Hepatitis A antibody, total     Screening examination for venereal disease       Relevant Orders   RPR   Urine cytology ancillary only   Cytology (oral, anal, urethral) ancillary only   Cytology (oral, anal, urethral) ancillary only     Healthcare maintenance       Relevant Orders   Hemoglobin A1c   HIV Antibody (routine testing w rflx)   QuantiFERON-TB Gold Plus   COMPLETE METABOLIC PANEL WITHOUT GFR   CBC   HIV RNA, RTPCR  W/R GT (RTI, PI,INT)   T-helper cell (CD4)- ([facility] clinic only)   RPR   Urine cytology ancillary only   Lipid panel   Hepatitis B surface antigen   Hepatitis B surface antibody,quantitative   Hepatitis B core antibody, total   Hepatitis C antibody   Toxoplasma gondii antibody, IgG   Varicella zoster antibody, IgG   Measles/Mumps/Rubella Immunity   Hepatitis A antibody, total     Polypharmacy       Relevant Orders   Hemoglobin A1c   HIV Antibody (routine testing w rflx)   QuantiFERON-TB Gold Plus   COMPLETE METABOLIC PANEL WITHOUT GFR   CBC   HIV RNA, RTPCR W/R GT (RTI, PI,INT)   T-helper cell (CD4)- ([facility] clinic only)   RPR   Urine cytology ancillary only   Lipid panel   Hepatitis B surface antigen   Hepatitis B surface antibody,quantitative   Hepatitis B core antibody, total   Hepatitis C antibody   Toxoplasma  gondii antibody, IgG   Varicella zoster antibody, IgG   Measles/Mumps/Rubella Immunity   Hepatitis A antibody, total        I am having Peter Hogan maintain his diphenhydrAMINE, emtricitabine-tenofovir, amoxicillin-clavulanate, and Biktarvy.   No orders of the defined types were placed in this encounter.    Follow-up: No follow-ups on file.

## 2023-08-16 NOTE — Progress Notes (Signed)
 Specialty Pharmacy Initiation Note   Peter Hogan is a 33 y.o. male who will be followed by the specialty pharmacy service for RxSp HIV    Review of administration, indication, effectiveness, safety, potential side effects, storage/disposable, and missed dose instructions occurred today for patient's specialty medication(s) Bictegravir-Emtricitab-Tenofov [facility])     Patient/Caregiver did not have any additional questions or concerns.   Patient's therapy is appropriate to: Continue    Goals Addressed             This Visit's Progress    Achieve Undetectable HIV Viral Load < 20       Patient is on track. Patient will maintain adherence      Increase CD4 count until steady state       Patient is on track. Patient will maintain adherence      Maintain optimal adherence to therapy       Patient is on track. Patient will maintain adherence         Jennette Kettle Specialty Pharmacist

## 2023-08-17 ENCOUNTER — Other Ambulatory Visit: Payer: Self-pay

## 2023-08-17 ENCOUNTER — Encounter: Payer: Self-pay | Admitting: Physician Assistant

## 2023-08-17 DIAGNOSIS — B20 Human immunodeficiency virus [HIV] disease: Secondary | ICD-10-CM | POA: Insufficient documentation

## 2023-08-17 DIAGNOSIS — Z79899 Other long term (current) drug therapy: Secondary | ICD-10-CM | POA: Insufficient documentation

## 2023-08-17 DIAGNOSIS — Z0000 Encounter for general adult medical examination without abnormal findings: Secondary | ICD-10-CM | POA: Insufficient documentation

## 2023-08-17 DIAGNOSIS — Z8619 Personal history of other infectious and parasitic diseases: Secondary | ICD-10-CM | POA: Insufficient documentation

## 2023-08-17 LAB — T-HELPER CELL (CD4) - (RCID CLINIC ONLY)
CD4 % Helper T Cell: 31 % — ABNORMAL LOW (ref 33–65)
CD4 T Cell Abs: 598 /uL (ref 400–1790)

## 2023-08-17 LAB — CYTOLOGY, (ORAL, ANAL, URETHRAL) ANCILLARY ONLY
Chlamydia: NEGATIVE
Chlamydia: NEGATIVE
Comment: NEGATIVE
Comment: NEGATIVE
Comment: NORMAL
Comment: NORMAL
Neisseria Gonorrhea: NEGATIVE
Neisseria Gonorrhea: NEGATIVE

## 2023-08-17 LAB — URINE CYTOLOGY ANCILLARY ONLY
Chlamydia: NEGATIVE
Comment: NEGATIVE
Comment: NORMAL
Neisseria Gonorrhea: NEGATIVE

## 2023-08-21 LAB — COMPLETE METABOLIC PANEL WITHOUT GFR
AG Ratio: 1.8 (calc) (ref 1.0–2.5)
ALT: 17 U/L (ref 9–46)
AST: 16 U/L (ref 10–40)
Albumin: 4.8 g/dL (ref 3.6–5.1)
Alkaline phosphatase (APISO): 74 U/L (ref 36–130)
BUN: 14 mg/dL (ref 7–25)
CO2: 25 mmol/L (ref 20–32)
Calcium: 9.6 mg/dL (ref 8.6–10.3)
Chloride: 103 mmol/L (ref 98–110)
Creat: 1.01 mg/dL (ref 0.60–1.26)
Globulin: 2.6 g/dL (ref 1.9–3.7)
Glucose, Bld: 79 mg/dL (ref 65–99)
Potassium: 4.1 mmol/L (ref 3.5–5.3)
Sodium: 136 mmol/L (ref 135–146)
Total Bilirubin: 0.8 mg/dL (ref 0.2–1.2)
Total Protein: 7.4 g/dL (ref 6.1–8.1)

## 2023-08-21 LAB — HEPATITIS C ANTIBODY: Hepatitis C Ab: NONREACTIVE

## 2023-08-21 LAB — LIPID PANEL
Cholesterol: 191 mg/dL (ref <200)
HDL: 48 mg/dL (ref >=40)
LDL Cholesterol (Calc): 127 mg/dL — ABNORMAL HIGH
Non-HDL Cholesterol (Calc): 143 mg/dL — ABNORMAL HIGH (ref <130)
Total CHOL/HDL Ratio: 4 (calc) (ref <5.0)
Triglycerides: 69 mg/dL (ref <150)

## 2023-08-21 LAB — HIV RNA, RTPCR W/R GT (RTI, PI,INT)
HIV 1 RNA Quant: NOT DETECTED {copies}/mL
HIV-1 RNA Quant, Log: NOT DETECTED {Log_copies}/mL

## 2023-08-21 LAB — HEPATITIS A ANTIBODY, TOTAL: Hepatitis A AB,Total: REACTIVE — AB

## 2023-08-21 LAB — CBC
HCT: 43.8 % (ref 38.5–50.0)
Hemoglobin: 15.3 g/dL (ref 13.2–17.1)
MCH: 33 pg (ref 27.0–33.0)
MCHC: 34.9 g/dL (ref 32.0–36.0)
MCV: 94.6 fL (ref 80.0–100.0)
MPV: 11.3 fL (ref 7.5–12.5)
Platelets: 244 10*3/uL (ref 140–400)
RBC: 4.63 10*6/uL (ref 4.20–5.80)
RDW: 12 % (ref 11.0–15.0)
WBC: 5.8 10*3/uL (ref 3.8–10.8)

## 2023-08-21 LAB — MEASLES/MUMPS/RUBELLA IMMUNITY
Mumps IgG: 258 [AU]/ml
Rubella: 1.12 {index}
Rubeola IgG: 93.4 [AU]/ml

## 2023-08-21 LAB — QUANTIFERON-TB GOLD PLUS
Mitogen-NIL: 8.02 [IU]/mL
NIL: 0.14 [IU]/mL
QuantiFERON-TB Gold Plus: NEGATIVE
TB1-NIL: 0.01 [IU]/mL
TB2-NIL: <0 [IU]/mL

## 2023-08-21 LAB — HEPATITIS B SURFACE ANTIBODY, QUANTITATIVE: Hep B S AB Quant (Post): 204 m[IU]/mL (ref >=10)

## 2023-08-21 LAB — HEMOGLOBIN A1C
Hgb A1c MFr Bld: 5.2 %{Hb} (ref <5.7)
Mean Plasma Glucose: 103 mg/dL
eAG (mmol/L): 5.7 mmol/L

## 2023-08-21 LAB — TOXOPLASMA GONDII ANTIBODY, IGG: Toxoplasma IgG Ratio: <7.2 [IU]/mL

## 2023-08-21 LAB — HIV-1/2 AB - DIFFERENTIATION
HIV-1 antibody: POSITIVE — AB
HIV-2 Ab: NEGATIVE

## 2023-08-21 LAB — VARICELLA ZOSTER ANTIBODY, IGG: Varicella IgG: <1 {s_co_ratio} — ABNORMAL LOW

## 2023-08-21 LAB — HEPATITIS B CORE ANTIBODY, TOTAL: Hep B Core Total Ab: NONREACTIVE

## 2023-08-21 LAB — RPR: RPR Ser Ql: NONREACTIVE

## 2023-08-21 LAB — HIV ANTIBODY (ROUTINE TESTING W REFLEX): HIV 1&2 Ab, 4th Generation: REACTIVE — AB

## 2023-08-21 LAB — HEPATITIS B SURFACE ANTIGEN: Hepatitis B Surface Ag: NONREACTIVE

## 2023-09-03 ENCOUNTER — Other Ambulatory Visit: Payer: Self-pay

## 2023-09-03 NOTE — Progress Notes (Signed)
 Specialty Pharmacy Refill Coordination Note  Peter Hogan is a 33 y.o. male contacted today regarding refills of specialty medication(s) Bictegravir-Emtricitab-Tenofov (Biktarvy )   Patient requested (Patient-Rptd) Delivery   Delivery date: (Patient-Rptd) 09/14/23   Verified address: (Patient-Rptd) [address] [place] [address]   Medication will be filled on 05.08.25.

## 2023-10-05 ENCOUNTER — Other Ambulatory Visit: Payer: Self-pay

## 2023-10-09 ENCOUNTER — Other Ambulatory Visit: Payer: Self-pay

## 2023-10-09 NOTE — Progress Notes (Signed)
 Specialty Pharmacy Refill Coordination Note  Peter Hogan is a 33 y.o. male contacted today regarding refills of specialty medication(s) Bictegravir-Emtricitab-Tenofov (Biktarvy )   Patient requested (Patient-Rptd) Delivery   Delivery date: 10/10/23   Verified address: (Patient-Rptd) [address], [facility] [address]   Medication will be filled on 06.03.25.   Patient selected invalid delivery date, notified of updated delivery date via mychart.
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
## 2023-10-22 ENCOUNTER — Ambulatory Visit (INDEPENDENT_AMBULATORY_CARE_PROVIDER_SITE_OTHER): Admitting: Pharmacist

## 2023-10-22 ENCOUNTER — Other Ambulatory Visit: Payer: Self-pay

## 2023-10-22 DIAGNOSIS — A549 Gonococcal infection, unspecified: Secondary | ICD-10-CM | POA: Diagnosis not present

## 2023-10-22 DIAGNOSIS — Z23 Encounter for immunization: Secondary | ICD-10-CM

## 2023-10-22 DIAGNOSIS — Z113 Encounter for screening for infections with a predominantly sexual mode of transmission: Secondary | ICD-10-CM

## 2023-10-22 MED ORDER — CEFTRIAXONE SODIUM 500 MG IJ SOLR: 500.0000 mg | Freq: Once | INTRAMUSCULAR | Status: AC

## 2023-10-23 ENCOUNTER — Other Ambulatory Visit: Payer: Self-pay | Admitting: Pharmacist

## 2023-10-23 DIAGNOSIS — Z113 Encounter for screening for infections with a predominantly sexual mode of transmission: Secondary | ICD-10-CM

## 2023-10-23 DIAGNOSIS — B20 Human immunodeficiency virus [HIV] disease: Secondary | ICD-10-CM

## 2023-10-23 LAB — GC/CHLAMYDIA PROBE, AMP (THROAT)
Chlamydia trachomatis RNA: NOT DETECTED
Neisseria gonorrhoeae RNA: NOT DETECTED

## 2023-10-23 LAB — CT/NG RNA, TMA RECTAL
Chlamydia Trachomatis RNA: NOT DETECTED
Neisseria Gonorrhoeae RNA: NOT DETECTED

## 2023-10-23 LAB — C. TRACHOMATIS/N. GONORRHOEAE RNA
C. trachomatis RNA, TMA: NOT DETECTED
N. gonorrhoeae RNA, TMA: NOT DETECTED

## 2023-10-23 LAB — RPR: RPR Ser Ql: NONREACTIVE

## 2023-10-23 MED ORDER — DOXYCYCLINE HYCLATE 100 MG PO TABS: 100.0000 mg | ORAL_TABLET | Freq: Two times a day (BID) | ORAL | 0 refills | Status: AC

## 2023-10-23 NOTE — Addendum Note (Signed)
 Addended by: Dilraj Killgore L on: 10/23/2023 01:50 PM   Modules accepted: Orders

## 2023-10-24 ENCOUNTER — Encounter: Payer: Self-pay | Admitting: Pharmacist

## 2023-10-30 ENCOUNTER — Other Ambulatory Visit: Payer: Self-pay

## 2023-10-31 ENCOUNTER — Other Ambulatory Visit: Payer: Self-pay

## 2023-10-31 NOTE — Progress Notes (Signed)
 Specialty Pharmacy Ongoing Clinical Assessment Note  Peter Hogan is a 33 y.o. male who is being followed by the specialty pharmacy service for RxSp HIV   Patient's specialty medication(s) reviewed today: Bictegravir-Emtricitab-Tenofov (Biktarvy )   Missed doses in the last 4 weeks: 0   Patient/Caregiver did not have any additional questions or concerns.   Therapeutic benefit summary: Patient is achieving benefit   Adverse events/side effects summary: No adverse events/side effects   Patient's therapy is appropriate to: Continue    Goals Addressed             This Visit's Progress    Achieve Undetectable HIV Viral Load < 20   On track    Patient is on track. Patient will maintain adherence. Patient undetectable as of 08/16/23      Increase CD4 count until steady state   On track    Patient is on track. Patient will maintain adherence      Maintain optimal adherence to therapy   On track    Patient is on track. Patient will maintain adherence         Follow up: 3 months  [facility] Specialty Pharmacist

## 2023-11-19 ENCOUNTER — Other Ambulatory Visit (HOSPITAL_COMMUNITY): Payer: Self-pay

## 2023-11-28 ENCOUNTER — Other Ambulatory Visit (HOSPITAL_COMMUNITY): Payer: Self-pay

## 2023-11-28 ENCOUNTER — Telehealth: Payer: Self-pay

## 2023-11-28 ENCOUNTER — Other Ambulatory Visit: Payer: Self-pay

## 2023-11-28 NOTE — Progress Notes (Signed)
Patient moved to [place].

## 2023-11-28 NOTE — Telephone Encounter (Signed)
 Patient to called ot appointment cancelled saying he has moved to [facility] [place] .

## 2023-12-18 ENCOUNTER — Ambulatory Visit: Admitting: Physician Assistant
